# Patient Record
Sex: Male | Born: 1995 | Race: Black or African American | Hispanic: No | Marital: Single | Smoking: Never smoker
Health system: Southern US, Community
[De-identification: ages and names within clinical notes are randomized; demographics above are authoritative.]

## PROBLEM LIST (undated history)

## (undated) DIAGNOSIS — W3400XA Accidental discharge from unspecified firearms or gun, initial encounter: Secondary | ICD-10-CM

## (undated) HISTORY — PX: OTHER SURGICAL HISTORY: SHX169

---

## 2015-09-25 ENCOUNTER — Emergency Department (HOSPITAL_COMMUNITY)

## 2015-09-25 ENCOUNTER — Emergency Department (HOSPITAL_COMMUNITY)
Admission: EM | Admit: 2015-09-25 | Discharge: 2015-09-25 | Disposition: A | Discharge status: Home / Self Care | Attending: Emergency Medicine | Admitting: Emergency Medicine

## 2015-09-25 ENCOUNTER — Encounter (HOSPITAL_COMMUNITY): Payer: Self-pay | Admitting: Emergency Medicine

## 2015-09-25 DIAGNOSIS — Y9389 Activity, other specified: Secondary | ICD-10-CM | POA: Insufficient documentation

## 2015-09-25 DIAGNOSIS — S0081XA Abrasion of other part of head, initial encounter: Secondary | ICD-10-CM | POA: Diagnosis not present

## 2015-09-25 DIAGNOSIS — S0083XA Contusion of other part of head, initial encounter: Secondary | ICD-10-CM | POA: Diagnosis not present

## 2015-09-25 DIAGNOSIS — W19XXXA Unspecified fall, initial encounter: Secondary | ICD-10-CM

## 2015-09-25 DIAGNOSIS — S40211A Abrasion of right shoulder, initial encounter: Secondary | ICD-10-CM | POA: Diagnosis not present

## 2015-09-25 DIAGNOSIS — Y998 Other external cause status: Secondary | ICD-10-CM | POA: Insufficient documentation

## 2015-09-25 DIAGNOSIS — Y9289 Other specified places as the place of occurrence of the external cause: Secondary | ICD-10-CM | POA: Diagnosis not present

## 2015-09-25 DIAGNOSIS — F1020 Alcohol dependence, uncomplicated: Secondary | ICD-10-CM

## 2015-09-25 DIAGNOSIS — X58XXXA Exposure to other specified factors, initial encounter: Secondary | ICD-10-CM | POA: Insufficient documentation

## 2015-09-25 DIAGNOSIS — F10229 Alcohol dependence with intoxication, unspecified: Secondary | ICD-10-CM | POA: Diagnosis not present

## 2015-09-25 DIAGNOSIS — IMO0001 Reserved for inherently not codable concepts without codable children: Secondary | ICD-10-CM

## 2015-09-25 DIAGNOSIS — J00 Acute nasopharyngitis [common cold]: Secondary | ICD-10-CM | POA: Insufficient documentation

## 2015-09-25 LAB — COMPREHENSIVE METABOLIC PANEL
ALBUMIN: 4.2 g/dL (ref 3.5–5.0)
ALT: 17 U/L (ref 17–63)
ANION GAP: 13 (ref 5–15)
AST: 30 U/L (ref 15–41)
Alkaline Phosphatase: 56 U/L (ref 38–126)
BUN: 11 mg/dL (ref 6–20)
CHLORIDE: 108 mmol/L (ref 101–111)
CO2: 22 mmol/L (ref 22–32)
Calcium: 8.8 mg/dL — ABNORMAL LOW (ref 8.9–10.3)
Creatinine, Ser: 1.21 mg/dL (ref 0.61–1.24)
GFR calc non Af Amer: >60 mL/min (ref >60)
GLUCOSE: 118 mg/dL — AB (ref 65–99)
POTASSIUM: 3.4 mmol/L — AB (ref 3.5–5.1)
SODIUM: 143 mmol/L (ref 135–145)
Total Bilirubin: 0.7 mg/dL (ref 0.3–1.2)
Total Protein: 7.7 g/dL (ref 6.5–8.1)

## 2015-09-25 LAB — CBC WITH DIFFERENTIAL/PLATELET
BASOS PCT: 1 %
Basophils Absolute: 0.1 10*3/uL (ref 0.0–0.1)
EOS ABS: 0.2 10*3/uL (ref 0.0–0.7)
EOS PCT: 4 %
HCT: 44.8 % (ref 39.0–52.0)
HEMOGLOBIN: 15.7 g/dL (ref 13.0–17.0)
Lymphocytes Relative: 43 %
Lymphs Abs: 2.6 10*3/uL (ref 0.7–4.0)
MCH: 30.7 pg (ref 26.0–34.0)
MCHC: 35 g/dL (ref 30.0–36.0)
MCV: 87.5 fL (ref 78.0–100.0)
MONOS PCT: 2 %
Monocytes Absolute: 0.1 10*3/uL (ref 0.1–1.0)
NEUTROS PCT: 50 %
Neutro Abs: 3 10*3/uL (ref 1.7–7.7)
PLATELETS: 185 10*3/uL (ref 150–400)
RBC: 5.12 MIL/uL (ref 4.22–5.81)
RDW: 12.9 % (ref 11.5–15.5)
WBC: 6.1 10*3/uL (ref 4.0–10.5)

## 2015-09-25 LAB — ETHANOL: Alcohol, Ethyl (B): 200 mg/dL — ABNORMAL HIGH (ref <5)

## 2015-09-25 MED ORDER — LACTATED RINGERS IV BOLUS (SEPSIS)
1000.0000 mL | Freq: Once | INTRAVENOUS | Status: AC
  Administered 2015-09-25: 1000 mL via INTRAVENOUS
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
## 2015-09-25 NOTE — ED Notes (Signed)
Dec to 2L [place], pt sats 100% currently

## 2015-09-25 NOTE — Discharge Instructions (Signed)
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
## 2015-09-25 NOTE — ED Notes (Addendum)
Nurse from close to lobby witnessed patient getting into taxi.  IV catheter found left in room removed by patient.  MD aware.
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
## 2015-11-04 ENCOUNTER — Encounter (HOSPITAL_COMMUNITY): Payer: Self-pay | Admitting: Emergency Medicine

## 2015-11-04 ENCOUNTER — Emergency Department (HOSPITAL_COMMUNITY)

## 2015-11-04 ENCOUNTER — Observation Stay (HOSPITAL_COMMUNITY)
Admission: EM | Admit: 2015-11-04 | Discharge: 2015-11-04 | Disposition: A | Discharge status: Home / Self Care | Attending: General Surgery | Admitting: General Surgery

## 2015-11-04 DIAGNOSIS — Z23 Encounter for immunization: Secondary | ICD-10-CM | POA: Insufficient documentation

## 2015-11-04 DIAGNOSIS — W3400XA Accidental discharge from unspecified firearms or gun, initial encounter: Secondary | ICD-10-CM | POA: Insufficient documentation

## 2015-11-04 DIAGNOSIS — S41132A Puncture wound without foreign body of left upper arm, initial encounter: Secondary | ICD-10-CM

## 2015-11-04 DIAGNOSIS — S31143A Puncture wound of abdominal wall with foreign body, right lower quadrant without penetration into peritoneal cavity, initial encounter: Principal | ICD-10-CM | POA: Insufficient documentation

## 2015-11-04 DIAGNOSIS — S31139A Puncture wound of abdominal wall without foreign body, unspecified quadrant without penetration into peritoneal cavity, initial encounter: Secondary | ICD-10-CM

## 2015-11-04 DIAGNOSIS — T148 Other injury of unspecified body region: Secondary | ICD-10-CM | POA: Diagnosis present

## 2015-11-04 LAB — COMPREHENSIVE METABOLIC PANEL
ALBUMIN: 3.6 g/dL (ref 3.5–5.0)
ALK PHOS: 53 U/L (ref 38–126)
ALT: 31 U/L (ref 17–63)
ANION GAP: 14 (ref 5–15)
AST: 53 U/L — ABNORMAL HIGH (ref 15–41)
BUN: 8 mg/dL (ref 6–20)
CALCIUM: 8.9 mg/dL (ref 8.9–10.3)
CO2: 22 mmol/L (ref 22–32)
Chloride: 105 mmol/L (ref 101–111)
Creatinine, Ser: 1.51 mg/dL — ABNORMAL HIGH (ref 0.61–1.24)
GFR calc non Af Amer: >60 mL/min (ref >60)
GLUCOSE: 106 mg/dL — AB (ref 65–99)
POTASSIUM: 3.6 mmol/L (ref 3.5–5.1)
SODIUM: 141 mmol/L (ref 135–145)
Total Bilirubin: 0.8 mg/dL (ref 0.3–1.2)
Total Protein: 6.6 g/dL (ref 6.5–8.1)

## 2015-11-04 LAB — CBC
HEMATOCRIT: 41.6 % (ref 39.0–52.0)
HEMATOCRIT: 39.4 % (ref 39.0–52.0)
HEMOGLOBIN: 13.8 g/dL (ref 13.0–17.0)
HEMOGLOBIN: 13.6 g/dL (ref 13.0–17.0)
MCH: 30.3 pg (ref 26.0–34.0)
MCH: 31.5 pg (ref 26.0–34.0)
MCHC: 33.2 g/dL (ref 30.0–36.0)
MCHC: 34.5 g/dL (ref 30.0–36.0)
MCV: 91.2 fL (ref 78.0–100.0)
MCV: 91.2 fL (ref 78.0–100.0)
Platelets: 192 10*3/uL (ref 150–400)
Platelets: 201 10*3/uL (ref 150–400)
RBC: 4.56 MIL/uL (ref 4.22–5.81)
RBC: 4.32 MIL/uL (ref 4.22–5.81)
RDW: 13.9 % (ref 11.5–15.5)
RDW: 14.1 % (ref 11.5–15.5)
WBC: 7.9 10*3/uL (ref 4.0–10.5)
WBC: 9.6 10*3/uL (ref 4.0–10.5)

## 2015-11-04 LAB — PROTIME-INR
INR: 1.43 (ref 0.00–1.49)
PROTHROMBIN TIME: 17.5 s — AB (ref 11.6–15.2)

## 2015-11-04 LAB — TYPE AND SCREEN
ABO/RH(D): B POS
Antibody Screen: NEGATIVE
UNIT DIVISION: 0
Unit division: 0

## 2015-11-04 LAB — I-STAT CHEM 8, ED
BUN: 10 mg/dL (ref 6–20)
Calcium, Ion: 1.04 mmol/L — ABNORMAL LOW (ref 1.12–1.23)
Chloride: 103 mmol/L (ref 101–111)
Creatinine, Ser: 1.4 mg/dL — ABNORMAL HIGH (ref 0.61–1.24)
Glucose, Bld: 102 mg/dL — ABNORMAL HIGH (ref 65–99)
HEMATOCRIT: 44 % (ref 39.0–52.0)
HEMOGLOBIN: 15 g/dL (ref 13.0–17.0)
POTASSIUM: 3.5 mmol/L (ref 3.5–5.1)
SODIUM: 142 mmol/L (ref 135–145)
TCO2: 24 mmol/L (ref 0–100)

## 2015-11-04 LAB — PREPARE FRESH FROZEN PLASMA
UNIT DIVISION: 0
UNIT DIVISION: 0

## 2015-11-04 LAB — BLOOD PRODUCT ORDER (VERBAL) VERIFICATION

## 2015-11-04 LAB — ETHANOL

## 2015-11-04 LAB — ABO/RH: ABO/RH(D): B POS

## 2015-11-04 MED ORDER — HYDROMORPHONE HCL 1 MG/ML IJ SOLN: 0.5000 mg | INTRAMUSCULAR | Status: DC | PRN

## 2015-11-04 MED ORDER — FENTANYL CITRATE (PF) 100 MCG/2ML IJ SOLN
INTRAMUSCULAR | Status: AC
  Filled 2015-11-04: qty 2

## 2015-11-04 MED ORDER — SODIUM CHLORIDE 0.9 % IV BOLUS (SEPSIS)
2000.0000 mL | Freq: Once | INTRAVENOUS | Status: AC
  Administered 2015-11-04: 2000 mL via INTRAVENOUS

## 2015-11-04 MED ORDER — DOCUSATE SODIUM 100 MG PO CAPS
100.0000 mg | ORAL_CAPSULE | Freq: Two times a day (BID) | ORAL | Status: DC
  Administered 2015-11-04: 100 mg via ORAL
  Filled 2015-11-04: qty 1

## 2015-11-04 MED ORDER — FENTANYL CITRATE (PF) 100 MCG/2ML IJ SOLN
100.0000 ug | Freq: Once | INTRAMUSCULAR | Status: AC
  Administered 2015-11-04: 100 ug via INTRAVENOUS

## 2015-11-04 MED ORDER — TETANUS-DIPHTH-ACELL PERTUSSIS 5-2.5-18.5 LF-MCG/0.5 IM SUSP
0.5000 mL | Freq: Once | INTRAMUSCULAR | Status: AC
  Administered 2015-11-04: 0.5 mL via INTRAMUSCULAR

## 2015-11-04 MED ORDER — ONDANSETRON HCL 4 MG/2ML IJ SOLN: 4.0000 mg | Freq: Four times a day (QID) | INTRAMUSCULAR | Status: DC | PRN

## 2015-11-04 MED ORDER — CEFAZOLIN SODIUM 1-5 GM-% IV SOLN
1.0000 g | Freq: Three times a day (TID) | INTRAVENOUS | Status: DC
  Filled 2015-11-04 (×3): qty 50

## 2015-11-04 MED ORDER — HYDROCODONE-ACETAMINOPHEN 5-325 MG PO TABS: 1.0000 | ORAL_TABLET | ORAL | Status: AC | PRN

## 2015-11-04 MED ORDER — NAPROXEN 500 MG PO TABS: 500.0000 mg | ORAL_TABLET | Freq: Two times a day (BID) | ORAL | Status: AC

## 2015-11-04 MED ORDER — LIDOCAINE HCL (PF) 1 % IJ SOLN
INTRAMUSCULAR | Status: AC
  Administered 2015-11-04: 5 mL
  Filled 2015-11-04: qty 5

## 2015-11-04 MED ORDER — OXYCODONE HCL 5 MG PO TABS: 10.0000 mg | ORAL_TABLET | ORAL | Status: DC | PRN

## 2015-11-04 MED ORDER — SODIUM CHLORIDE 0.9% FLUSH: 3.0000 mL | Freq: Two times a day (BID) | INTRAVENOUS | Status: DC

## 2015-11-04 MED ORDER — IOHEXOL 300 MG/ML  SOLN
100.0000 mL | Freq: Once | INTRAMUSCULAR | Status: AC | PRN
  Administered 2015-11-04: 100 mL via INTRAVENOUS

## 2015-11-04 MED ORDER — LIDOCAINE HCL (PF) 1 % IJ SOLN
INTRAMUSCULAR | Status: AC
  Filled 2015-11-04: qty 5

## 2015-11-04 MED ORDER — ONDANSETRON HCL 4 MG PO TABS: 4.0000 mg | ORAL_TABLET | Freq: Four times a day (QID) | ORAL | Status: DC | PRN

## 2015-11-04 MED ORDER — SODIUM CHLORIDE 0.9 % IV SOLN: 250.0000 mL | INTRAVENOUS | Status: DC | PRN

## 2015-11-04 MED ORDER — CEFAZOLIN SODIUM-DEXTROSE 2-3 GM-% IV SOLR
2.0000 g | Freq: Once | INTRAVENOUS | Status: AC
  Administered 2015-11-04: 2 g via INTRAVENOUS
  Filled 2015-11-04: qty 50

## 2015-11-04 MED ORDER — SODIUM CHLORIDE 0.9% FLUSH: 3.0000 mL | INTRAVENOUS | Status: DC | PRN

## 2015-11-04 MED ORDER — SODIUM CHLORIDE 0.9 % IV BOLUS (SEPSIS)
1000.0000 mL | Freq: Once | INTRAVENOUS | Status: AC
  Administered 2015-11-04: 1000 mL via INTRAVENOUS

## 2015-11-04 MED ORDER — TETANUS-DIPHTH-ACELL PERTUSSIS 5-2.5-18.5 LF-MCG/0.5 IM SUSP
INTRAMUSCULAR | Status: AC
  Filled 2015-11-04: qty 0.5
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
## 2015-11-04 NOTE — Discharge Instructions (Signed)
No driving while taking hydrocodone. ° °Wash wounds daily in shower with soap and water. °Do not soak. °Apply antibiotic ointment (e.g. Neosporin) twice daily and as needed to keep moist. °Cover with dry dressing. ° °

## 2015-11-04 NOTE — Progress Notes (Signed)
Pt's mom wanted to speak with PA about xrays Valeta Harms PA called back spoke with Mom

## 2015-11-05 ENCOUNTER — Encounter (HOSPITAL_COMMUNITY): Payer: Self-pay | Admitting: Emergency Medicine

## 2015-11-05 LAB — CDS SEROLOGY

## 2015-11-16 ENCOUNTER — Emergency Department (HOSPITAL_COMMUNITY)
Admission: EM | Admit: 2015-11-16 | Discharge: 2015-11-16 | Disposition: A | Discharge status: Home / Self Care | Attending: Emergency Medicine | Admitting: Emergency Medicine

## 2015-11-16 ENCOUNTER — Encounter (HOSPITAL_COMMUNITY): Payer: Self-pay | Admitting: Emergency Medicine

## 2015-11-16 DIAGNOSIS — Y658 Other specified misadventures during surgical and medical care: Secondary | ICD-10-CM | POA: Diagnosis not present

## 2015-11-16 DIAGNOSIS — Z791 Long term (current) use of non-steroidal anti-inflammatories (NSAID): Secondary | ICD-10-CM | POA: Diagnosis not present

## 2015-11-16 DIAGNOSIS — T8131XA Disruption of external operation (surgical) wound, not elsewhere classified, initial encounter: Secondary | ICD-10-CM | POA: Insufficient documentation

## 2015-11-16 NOTE — Discharge Instructions (Signed)
Wound Dehiscence °Wound dehiscence is when a surgical cut (incision) breaks open and does not heal properly after surgery. It usually happens 7-10 days after surgery. This can be a serious condition. It is important to identify and treat this condition early.  °CAUSES  °Some common causes of wound dehiscence include: °· Stretching of the wound area. This may be caused by lifting, vomiting, violent coughing, or straining during bowel movements. °· Wound infection. °· Early stitch (suture) removal. °RISK FACTORS °Various things can increase your risk of developing wound dehiscence, including: °· Obesity. °· Lung disease. °· Smoking. °· Poor nutrition. °· Contamination during surgery. °SIGNS AND SYMPTOMS °· Bleeding from the wound. °· Pain. °· Fever. °· Wound starts breaking open. °DIAGNOSIS  °· Your health care provider may diagnose wound dehiscence by monitoring the incision and noting any changes in the wound. These changes can include an increase in drainage or pain. The health care provider may also ask you if you have noticed any stretching or tearing of the wound. °· Wound cultures may be taken to determine if there is an infection.  °· Imaging studies, such as an MRI scan or CT scan, may be done to determine if there is a collection of pus or fluid in the wound area. °TREATMENT °Treatment may include: °· Wound care. °· Surgical repair. °· Antibiotic medicine to treat or prevent infection. °· Medicines to reduce pain and swelling. °HOME CARE INSTRUCTIONS  °· Only take over-the-counter or prescription medicines for pain, discomfort, or fever as directed by your health care provider. Taking pain medicine 30 minutes before changing a bandage (dressing) can help relieve pain. °· Take your antibiotics as directed. Finish them even if you start to feel better. °· Gently wash the area with mild soap and water 2 times a day, or as directed. Rinse off the soap. Pat the area dry with a clean towel. Do not rub the wound.  This may cause bleeding. °· Follow your health care provider's instructions for how often you need to change the dressing and packing inside. Wash your hands well before and after changing your dressing. Apply a dressing to the wound as directed. °· Take showers. Do not soak the wound, bathe, swim, or use a hot tub until directed by your health care provider. °· Avoid exercises that make you sweat heavily. °· Use anti-itch medicine as directed by your health care provider. The wound may itch when it is healing. Do not pick or scratch at the wound. °· Do not lift more than 10 pounds (4.5 kg) until the wound is healed, or as directed by your health care provider. °· Keep all follow-up appointments as directed. °SEEK MEDICAL CARE IF: °· You have excessive bleeding from your surgical wound. °· Your wound does not seem to be healing properly. °· You have a fever. °SEEK IMMEDIATE MEDICAL CARE IF:  °· You have increased swelling or redness around the wound. °· You have increasing pain in the wound. °· You have an increasing amount of pus coming from the wound. °· Your wound breaks open farther. °MAKE SURE YOU:  °· Understand these instructions. °· Will watch your condition. °· Will get help right away if you are not doing well or get worse. °  °This information is not intended to replace advice given to you by your health care provider. Make sure you discuss any questions you have with your health care provider. °  °Document Released: 11/25/2003 Document Revised: 09/09/2013 Document Reviewed: 05/12/2013 °Elsevier Interactive Patient Education ©  2016 [employer] ° °
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
## 2016-05-21 ENCOUNTER — Emergency Department (HOSPITAL_COMMUNITY)
Admission: EM | Admit: 2016-05-21 | Discharge: 2016-05-21 | Disposition: A | Discharge status: Home / Self Care | Attending: Emergency Medicine | Admitting: Emergency Medicine

## 2016-05-21 ENCOUNTER — Encounter (HOSPITAL_COMMUNITY): Payer: Self-pay

## 2016-05-21 DIAGNOSIS — J029 Acute pharyngitis, unspecified: Secondary | ICD-10-CM | POA: Diagnosis present

## 2016-05-21 DIAGNOSIS — A388 Scarlet fever with other complications: Secondary | ICD-10-CM

## 2016-05-21 DIAGNOSIS — J020 Streptococcal pharyngitis: Secondary | ICD-10-CM | POA: Insufficient documentation

## 2016-05-21 LAB — RAPID STREP SCREEN (MED CTR MEBANE ONLY): Streptococcus, Group A Screen (Direct): POSITIVE — AB

## 2016-05-21 MED ORDER — PREDNISONE 20 MG PO TABS
60.0000 mg | ORAL_TABLET | Freq: Once | ORAL | Status: AC
  Administered 2016-05-21: 60 mg via ORAL
  Filled 2016-05-21: qty 3

## 2016-05-21 MED ORDER — PENICILLIN G BENZATHINE & PROC 1200000 UNIT/2ML IM SUSP
1.2000 10*6.[IU] | Freq: Once | INTRAMUSCULAR | Status: AC
  Administered 2016-05-21: 1.2 10*6.[IU] via INTRAMUSCULAR
  Filled 2016-05-21: qty 2

## 2016-05-21 MED ORDER — LORATADINE 10 MG PO TABS
10.0000 mg | ORAL_TABLET | Freq: Once | ORAL | Status: AC
  Administered 2016-05-21: 10 mg via ORAL
  Filled 2016-05-21: qty 1

## 2016-05-21 MED ORDER — FAMOTIDINE 20 MG PO TABS
20.0000 mg | ORAL_TABLET | Freq: Once | ORAL | Status: AC
  Administered 2016-05-21: 20 mg via ORAL
  Filled 2016-05-21: qty 1

## 2016-05-21 NOTE — ED Notes (Signed)
Declined W/C at D/C and was escorted to lobby by RN. 
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
## 2016-05-21 NOTE — Discharge Instructions (Signed)
Take tylenol and ibuprofen as needed for pain or fever. Return here for worsening symptoms such as difficulty swallowing, high fever, neck pain or other problems.

## 2016-05-21 NOTE — ED Triage Notes (Signed)
Patient here with itchy rash to skin since Friday. Patient states I think I am having a reaction to something, no respiratory issues, has ben taking benadryl with some relief. Has not taken any today

## 2016-09-06 ENCOUNTER — Ambulatory Visit (HOSPITAL_COMMUNITY)
Admission: EM | Admit: 2016-09-06 | Discharge: 2016-09-06 | Disposition: A | Discharge status: Home / Self Care | Attending: Emergency Medicine | Admitting: Emergency Medicine

## 2016-09-06 ENCOUNTER — Encounter (HOSPITAL_COMMUNITY): Payer: Self-pay | Admitting: Emergency Medicine

## 2016-09-06 DIAGNOSIS — Z79899 Other long term (current) drug therapy: Secondary | ICD-10-CM | POA: Insufficient documentation

## 2016-09-06 DIAGNOSIS — R300 Dysuria: Secondary | ICD-10-CM | POA: Insufficient documentation

## 2016-09-06 DIAGNOSIS — A64 Unspecified sexually transmitted disease: Secondary | ICD-10-CM | POA: Diagnosis present

## 2016-09-06 HISTORY — DX: Accidental discharge from unspecified firearms or gun, initial encounter: W34.00XA

## 2016-09-06 LAB — POCT URINALYSIS DIP (DEVICE)
Bilirubin Urine: NEGATIVE
GLUCOSE, UA: NEGATIVE mg/dL
Hgb urine dipstick: NEGATIVE
Ketones, ur: NEGATIVE mg/dL
Nitrite: NEGATIVE
PROTEIN: NEGATIVE mg/dL
Specific Gravity, Urine: 1.015 (ref 1.005–1.030)
UROBILINOGEN UA: 0.2 mg/dL (ref 0.0–1.0)
pH: 7 (ref 5.0–8.0)

## 2016-09-06 MED ORDER — AZITHROMYCIN 250 MG PO TABS
ORAL_TABLET | ORAL | Status: AC
  Filled 2016-09-06: qty 4

## 2016-09-06 MED ORDER — AZITHROMYCIN 250 MG PO TABS
1000.0000 mg | ORAL_TABLET | Freq: Once | ORAL | Status: AC
  Administered 2016-09-06: 1000 mg via ORAL

## 2016-09-06 MED ORDER — LIDOCAINE HCL (PF) 1 % IJ SOLN
INTRAMUSCULAR | Status: AC
  Filled 2016-09-06: qty 2

## 2016-09-06 MED ORDER — CEFTRIAXONE SODIUM 250 MG IJ SOLR
250.0000 mg | Freq: Once | INTRAMUSCULAR | Status: AC
  Administered 2016-09-06: 250 mg via INTRAMUSCULAR

## 2016-09-06 MED ORDER — CEFTRIAXONE SODIUM 250 MG IJ SOLR
INTRAMUSCULAR | Status: AC
  Filled 2016-09-06: qty 250

## 2016-09-06 NOTE — ED Notes (Signed)
Sent for clean and dirty urine specimens

## 2016-09-06 NOTE — Discharge Instructions (Signed)
We went ahead and treat you for gonorrhea and Chlamydia today, just to be safe. The results from your testing will be back in 2-3 days. Follow-up as needed.

## 2016-09-06 NOTE — ED Triage Notes (Signed)
Intermittent episodes of burning urination. Patient has noticed this cycle of burning episodes for a month.  Denies discharge.  Denies back or abdominal pain
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
## 2016-09-08 LAB — URINE CYTOLOGY ANCILLARY ONLY
CHLAMYDIA, DNA PROBE: POSITIVE — AB
NEISSERIA GONORRHEA: NEGATIVE
Trichomonas: NEGATIVE
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
